# Patient Record
Sex: Male | Born: 1980 | Race: Black or African American | Hispanic: No | Marital: Single | Smoking: Current every day smoker
Health system: Southern US, Community
[De-identification: ages and names within clinical notes are randomized; demographics above are authoritative.]

---
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
## 2002-05-01 ENCOUNTER — Emergency Department (HOSPITAL_COMMUNITY): Admission: EM | Admit: 2002-05-01 | Discharge: 2002-05-01 | Payer: Self-pay | Admitting: *Deleted

## 2003-10-07 ENCOUNTER — Inpatient Hospital Stay (HOSPITAL_COMMUNITY): Admission: EM | Admit: 2003-10-07 | Discharge: 2003-10-07 | Payer: Self-pay | Admitting: Emergency Medicine

## 2005-01-05 IMAGING — CT CT ABDOMEN W/ CM
1 of 4 series · 14 of 32 positions shown, 19 images · IV contrast (omnipaque)
Comparison: none

CLINICAL DATA: Right lower quadrant pain.
 CT ABDOMEN WITH CONTRAST
 Spiral CT after 100 mL Omnipaque 300 IV and rectal contrast.  No previous study currently available for comparison. Visualized portions of the lung bases are clear.  Unremarkable liver, gallbladder, spleen, adrenal glands, kidneys, pancreas.  Small bowel decompressed.  No free air.  No ascites.  Aorta normal in caliber.  Subcentimeter mesenteric lymph nodes are noted around the SMA and root of the mesentery.  Portal veins patent.  
 IMPRESSION
 Unremarkable CT abdomen.
 CT OF THE PELVIS WITH CONTRAST
 Spiral CT after rectal and IV contrast as above.  The appendix is seen inferior to the cecum.  Proximally it is air filled, but distally it is of fluid attenuation and is dilated to 15 mm diameter.  There is no definite adjacent inflammatory change.  Similar findings were described on a CT from [REDACTED] on 07/26/01, although these films are currently unavailable for direct comparison.  The remainder of the colon is well distended with contrast and unremarkable.  There is no free fluid.  Urinary bladder physiologically distended.  
 1.  Fluid filled distal appendix which is dilated with minimal if any adjacent inflammatory change.  This would suggest the possibility of early appendicitis, although identical findings were described on a previous CT from 07/26/01 as above.
 2.  Otherwise unremarkable CT pelvis.

[Series 3: abd/pelvis 5.0 b30f · axial · 0.61mm/px · z∈[-584,-214]mm · 14 of 84 slices shown, 19 images]
[im 5/84  soft-tissue]
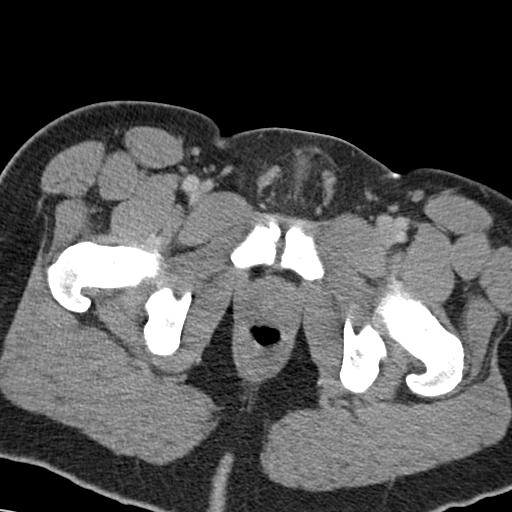
[im 5/84  bone]
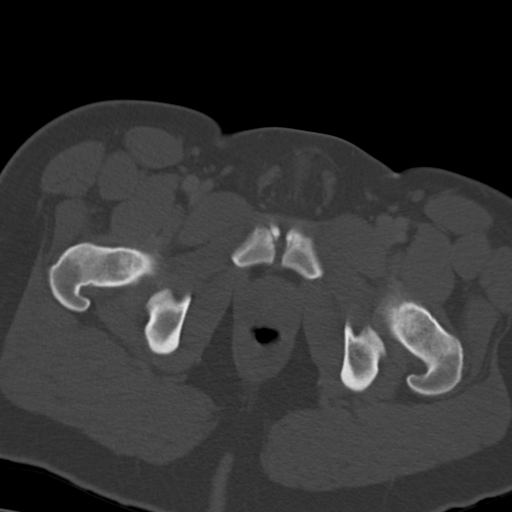
[im 14/84  soft-tissue]
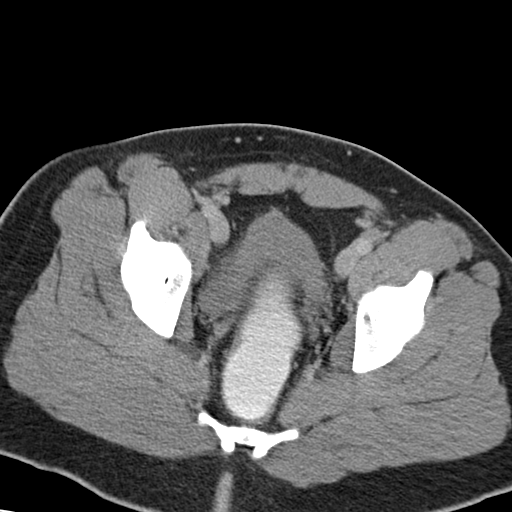
[im 18/84  soft-tissue]
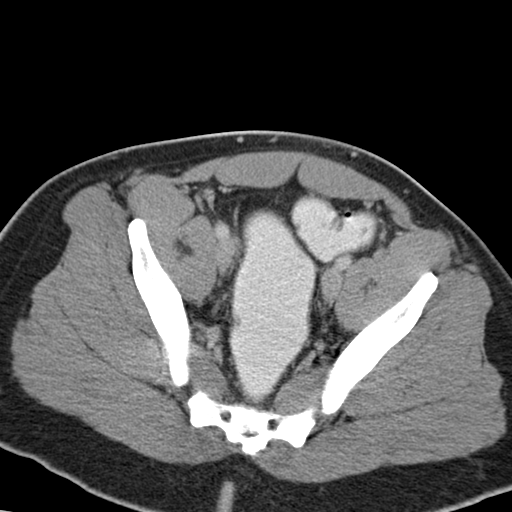
[im 22/84  soft-tissue]
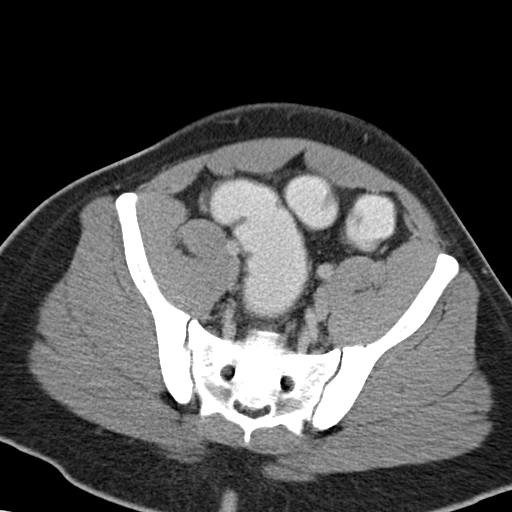
[im 31/84  soft-tissue]
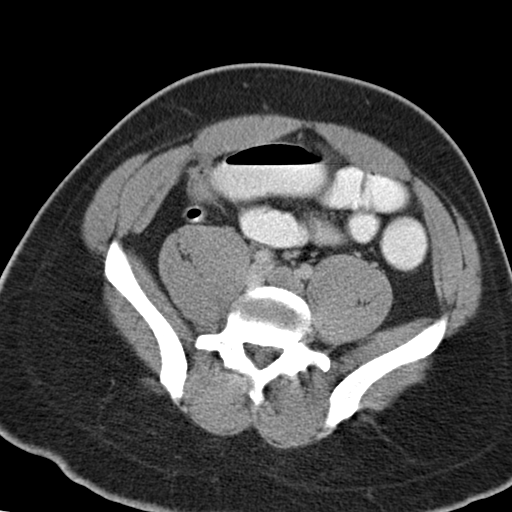
[im 35/84  soft-tissue]
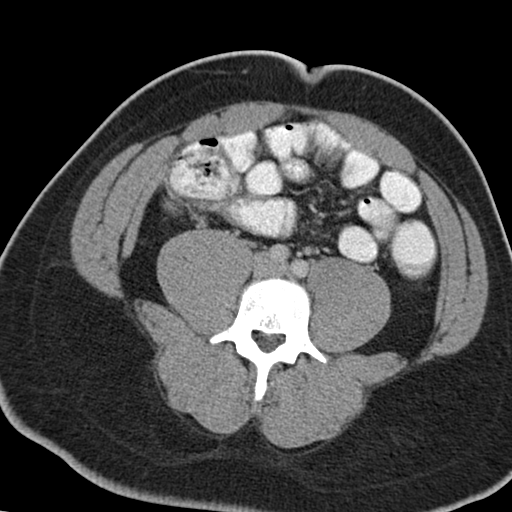
[im 44/84  soft-tissue]
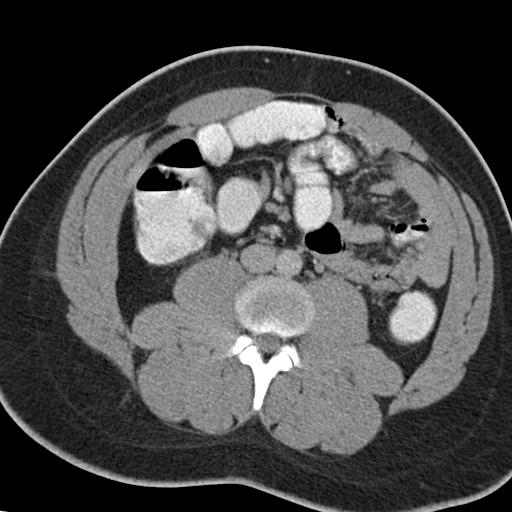
[im 49/84  soft-tissue]
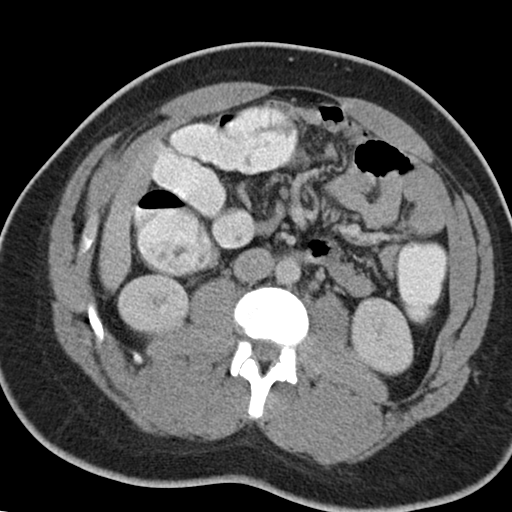
[im 53/84  soft-tissue]
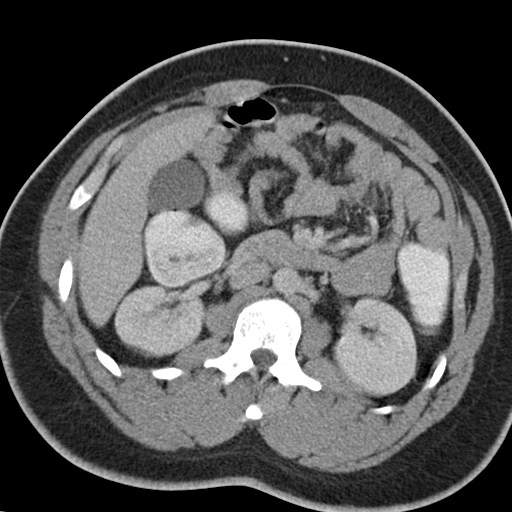
[im 53/84  bone]
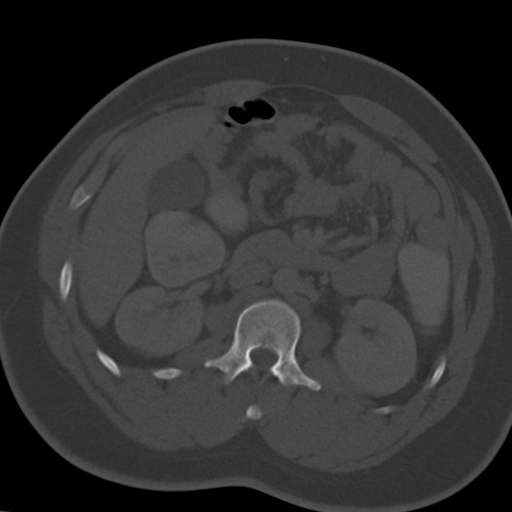
[im 62/84  soft-tissue]
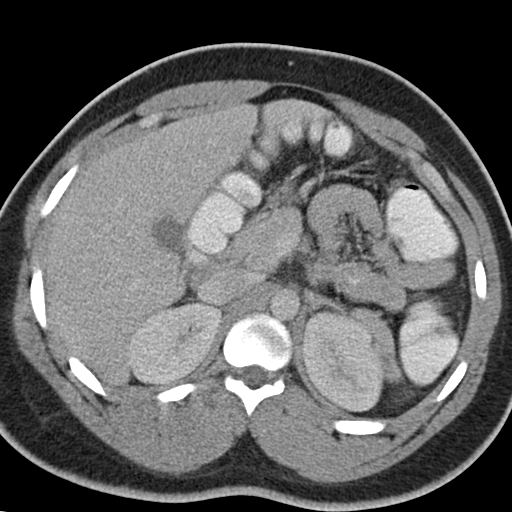
[im 66/84  soft-tissue]
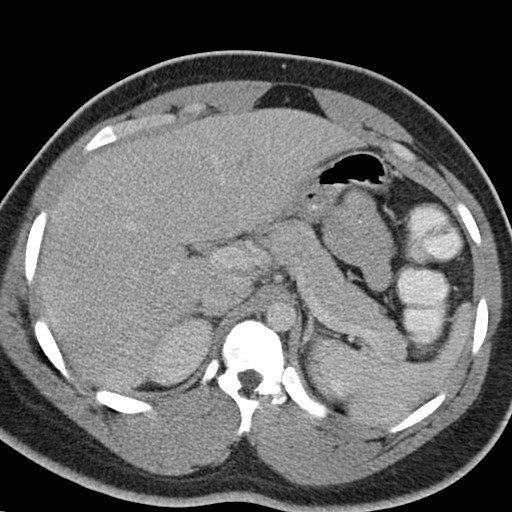
[im 66/84  lung]
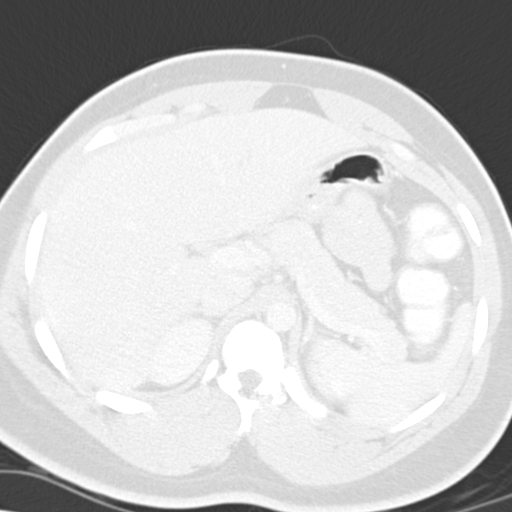
[im 70/84  soft-tissue]
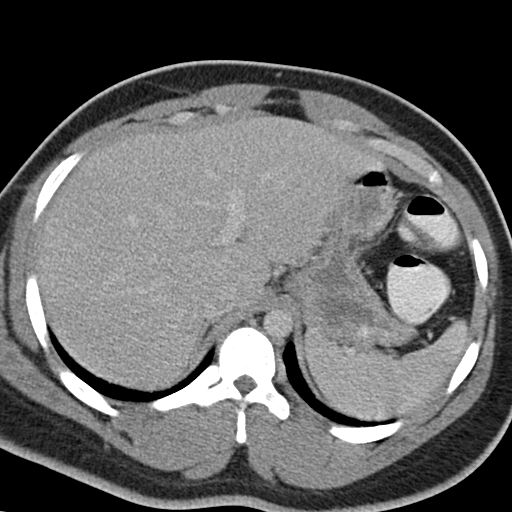
[im 70/84  lung]
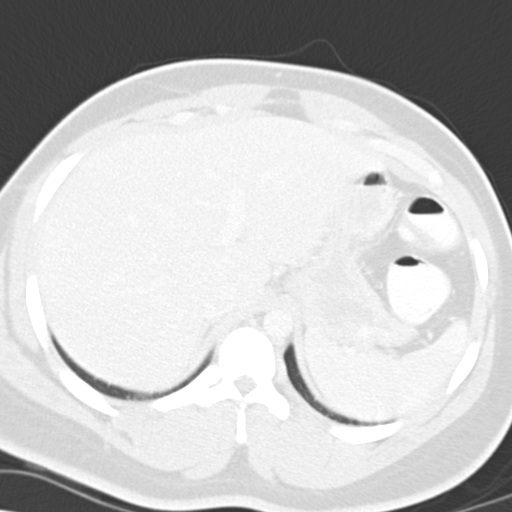
[im 75/84  lung]
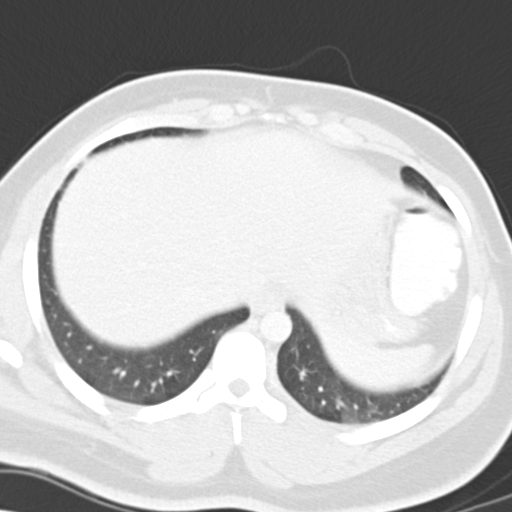
[im 79/84  soft-tissue]
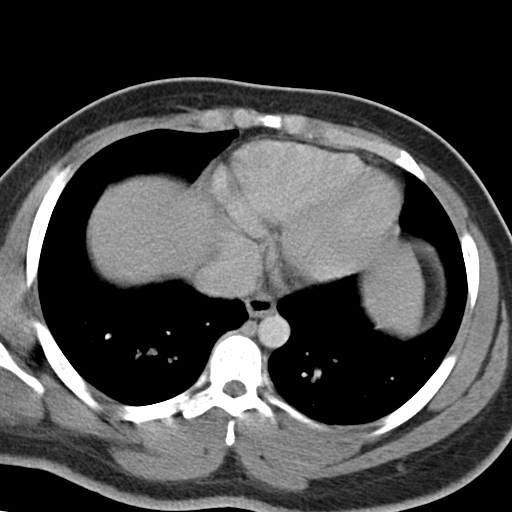
[im 79/84  lung]
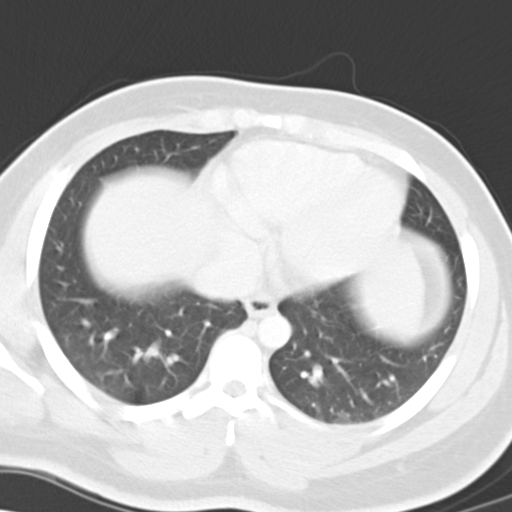

[14 of 32 positions shown; findings below may reference images not displayed]

## 2005-03-15 ENCOUNTER — Emergency Department (HOSPITAL_COMMUNITY): Admission: EM | Admit: 2005-03-15 | Discharge: 2005-03-15 | Payer: Self-pay | Admitting: Emergency Medicine

## 2006-02-14 ENCOUNTER — Emergency Department (HOSPITAL_COMMUNITY): Admission: EM | Admit: 2006-02-14 | Discharge: 2006-02-14 | Payer: Self-pay | Admitting: Family Medicine

## 2012-02-22 ENCOUNTER — Emergency Department (HOSPITAL_COMMUNITY)
Admission: EM | Admit: 2012-02-22 | Discharge: 2012-02-23 | Disposition: A | Discharge status: Home / Self Care | Payer: Self-pay | Attending: Emergency Medicine | Admitting: Emergency Medicine

## 2012-02-22 ENCOUNTER — Encounter (HOSPITAL_COMMUNITY): Payer: Self-pay | Admitting: Emergency Medicine

## 2012-02-22 DIAGNOSIS — F17200 Nicotine dependence, unspecified, uncomplicated: Secondary | ICD-10-CM | POA: Insufficient documentation

## 2012-02-22 DIAGNOSIS — H9202 Otalgia, left ear: Secondary | ICD-10-CM

## 2012-02-22 DIAGNOSIS — H9209 Otalgia, unspecified ear: Secondary | ICD-10-CM | POA: Insufficient documentation

## 2012-02-22 MED ORDER — TRAMADOL HCL 50 MG PO TABS: 50.0000 mg | ORAL_TABLET | Freq: Four times a day (QID) | ORAL | Status: AC | PRN

## 2012-02-22 MED ORDER — ANTIPYRINE-BENZOCAINE 5.4-1.4 % OT SOLN
3.0000 [drp] | OTIC | Status: AC | PRN
Start: 2012-02-22 — End: 2012-02-29

## 2012-02-22 NOTE — Discharge Instructions (Signed)

## 2012-02-22 NOTE — ED Provider Notes (Signed)
History     CSN: [id number]  Arrival date & time 02/22/12  2200   First MD Initiated Contact with Patient 02/22/12 2316      Chief Complaint  Patient presents with  . Otalgia    (Consider location/radiation/quality/duration/timing/severity/associated sxs/prior treatment) HPI Pt woke with L ear pain. No trauma, fever, rhinorrhea. +L neck pain. No change in hearing.  History reviewed. No pertinent past medical history.  History reviewed. No pertinent past surgical history.  No family history on file.  History  Substance Use Topics  . Smoking status: Current Everyday Smoker    Types: Cigarettes  . Smokeless tobacco: Not on file  . Alcohol Use: No      Review of Systems  Constitutional: Negative for fever and chills.  HENT: Positive for ear pain. Negative for hearing loss, congestion, sore throat, facial swelling, rhinorrhea, sneezing, trouble swallowing, neck pain, neck stiffness, dental problem, postnasal drip, sinus pressure, tinnitus and ear discharge.   Eyes: Positive for visual disturbance.  Respiratory: Negative for shortness of breath and wheezing.   Cardiovascular: Negative for chest pain.  Skin: Negative for rash.  Neurological: Negative for dizziness, weakness, light-headedness and numbness.    Allergies  Review of patient's allergies indicates no known allergies.  Home Medications   Current Outpatient Rx  Name Route Sig Dispense Refill  . ANTIPYRINE-BENZOCAINE 5.4-1.4 % OT SOLN Left Ear Place 3 drops into the left ear every 2 (two) hours as needed for pain. 10 mL 0  . TRAMADOL HCL 50 MG PO TABS Oral Take 1 tablet (50 mg total) by mouth every 6 (six) hours as needed for pain. 15 tablet 0    BP 126/76  Pulse 78  Temp(Src) 98.2 F (36.8 C) (Oral)  Resp 20  SpO2 96%  Physical Exam  Nursing note and vitals reviewed. Constitutional: He is oriented to person, place, and time. He appears well-developed and well-nourished. No distress.  HENT:  Head:  Normocephalic and atraumatic.  Right Ear: External ear normal.  Mouth/Throat: Oropharynx is clear and moist.       L auditory canal with mild irritation. No evidence of FB or infection. TM intact with sclerosis  Eyes: EOM are normal. Pupils are equal, round, and reactive to light.  Neck: Normal range of motion. Neck supple.  Cardiovascular: Normal rate and regular rhythm.   Pulmonary/Chest: Effort normal and breath sounds normal. No respiratory distress. He has no wheezes. He has no rales.  Abdominal: Soft. Bowel sounds are normal. There is no tenderness. There is no rebound and no guarding.  Musculoskeletal: Normal range of motion. He exhibits no edema and no tenderness.  Neurological: He is alert and oriented to person, place, and time.  Skin: Skin is warm and dry. No rash noted. No erythema.  Psychiatric: He has a normal mood and affect. His behavior is normal.    ED Course  Procedures (including critical care time)  Labs Reviewed - No data to display No results found.   1. Otalgia of left ear       MDM          Loren Racer, MD 02/22/12 [phone]

## 2012-02-22 NOTE — ED Notes (Signed)
Patient with left ear pain that started today.  Patient is having a hard time talking due to the pain.

## 2019-10-03 ENCOUNTER — Other Ambulatory Visit: Payer: Self-pay

## 2019-10-03 DIAGNOSIS — Z20822 Contact with and (suspected) exposure to covid-19: Secondary | ICD-10-CM

## 2019-10-06 LAB — NOVEL CORONAVIRUS, NAA: SARS-CoV-2, NAA: NOT DETECTED

## 2019-11-08 ENCOUNTER — Ambulatory Visit: Payer: Self-pay | Attending: Internal Medicine

## 2019-11-08 DIAGNOSIS — U071 COVID-19: Secondary | ICD-10-CM | POA: Insufficient documentation

## 2019-11-08 DIAGNOSIS — Z20822 Contact with and (suspected) exposure to covid-19: Secondary | ICD-10-CM

## 2019-11-10 ENCOUNTER — Telehealth: Payer: Self-pay | Admitting: Adult Health

## 2019-11-10 LAB — NOVEL CORONAVIRUS, NAA: SARS-CoV-2, NAA: DETECTED — AB

## 2019-11-10 NOTE — Telephone Encounter (Signed)
Patient called about Positive Covid test.  2 patient identifiers confirmed.  Date Tested: 11/08/2019  Date of Symptom onset: none   Symptoms: none, was exposed to covid by his children      Isolation Recommendations:  Patient understands the needs to stay in isolation for a total of 10 days from onset of symptom or 14 days total from date of testing if no symptom. Reviewed Masking.    Supportive Care Recommendations: Encouraged plenty of fluid intake, Tylenol per package directions, and to remain as active as possible.    Patient knows the health department may be in touch.    I answered all of patient's questions and all concerns addressed.  Time Spent: 6 minutes  Lillard Anes, NP

## 2019-11-20 ENCOUNTER — Ambulatory Visit: Payer: Self-pay | Attending: Internal Medicine

## 2019-11-20 DIAGNOSIS — Z20822 Contact with and (suspected) exposure to covid-19: Secondary | ICD-10-CM | POA: Insufficient documentation

## 2019-11-21 LAB — NOVEL CORONAVIRUS, NAA: SARS-CoV-2, NAA: NOT DETECTED

## 2020-07-03 ENCOUNTER — Other Ambulatory Visit: Payer: Self-pay

## 2020-07-21 ENCOUNTER — Other Ambulatory Visit: Payer: Self-pay

## 2020-07-22 ENCOUNTER — Other Ambulatory Visit: Payer: Self-pay

## 2020-07-22 DIAGNOSIS — Z20822 Contact with and (suspected) exposure to covid-19: Secondary | ICD-10-CM

## 2020-07-24 LAB — SARS-COV-2, NAA 2 DAY TAT

## 2020-07-24 LAB — NOVEL CORONAVIRUS, NAA: SARS-CoV-2, NAA: NOT DETECTED
